# Patient Record
Sex: Male | Born: 1975 | Race: White | Hispanic: No | Marital: Married | Smoking: Current every day smoker
Health system: Southern US, Community
[De-identification: ages and names within clinical notes are randomized; demographics above are authoritative.]

## PROBLEM LIST (undated history)

## (undated) DIAGNOSIS — K219 Gastro-esophageal reflux disease without esophagitis: Secondary | ICD-10-CM

## (undated) DIAGNOSIS — I10 Essential (primary) hypertension: Secondary | ICD-10-CM

## (undated) HISTORY — PX: KNEE SURGERY: SHX244

---

## 2005-03-15 ENCOUNTER — Emergency Department: Payer: Self-pay | Admitting: Unknown Physician Specialty

## 2008-10-22 ENCOUNTER — Emergency Department (HOSPITAL_COMMUNITY): Admission: EM | Admit: 2008-10-22 | Discharge: 2008-10-23 | Payer: Self-pay | Admitting: Emergency Medicine

## 2009-07-29 ENCOUNTER — Emergency Department: Payer: Self-pay | Admitting: Emergency Medicine

## 2011-08-26 LAB — RAPID URINE DRUG SCREEN, HOSP PERFORMED
Amphetamines: NOT DETECTED
Barbiturates: NOT DETECTED
Benzodiazepines: NOT DETECTED

## 2011-08-26 LAB — POCT I-STAT, CHEM 8
BUN: 5 mg/dL — ABNORMAL LOW (ref 6–23)
Calcium, Ion: 1.04 mmol/L — ABNORMAL LOW (ref 1.12–1.32)
Chloride: 107 mEq/L (ref 96–112)
Creatinine, Ser: 0.9 mg/dL (ref 0.4–1.5)
Glucose, Bld: 91 mg/dL (ref 70–99)

## 2017-02-01 ENCOUNTER — Encounter: Payer: Self-pay | Admitting: Emergency Medicine

## 2017-02-01 ENCOUNTER — Ambulatory Visit
Admission: EM | Admit: 2017-02-01 | Discharge: 2017-02-01 | Disposition: A | Discharge status: Home / Self Care | Payer: 59 | Attending: Family Medicine | Admitting: Family Medicine

## 2017-02-01 DIAGNOSIS — J988 Other specified respiratory disorders: Secondary | ICD-10-CM

## 2017-02-01 DIAGNOSIS — B9789 Other viral agents as the cause of diseases classified elsewhere: Secondary | ICD-10-CM

## 2017-02-01 DIAGNOSIS — R05 Cough: Secondary | ICD-10-CM

## 2017-02-01 DIAGNOSIS — R059 Cough, unspecified: Secondary | ICD-10-CM

## 2017-02-01 HISTORY — DX: Essential (primary) hypertension: I10

## 2017-02-01 MED ORDER — BENZONATATE 100 MG PO CAPS: 100.0000 mg | ORAL_CAPSULE | Freq: Three times a day (TID) | ORAL | 0 refills | Status: DC | PRN

## 2017-02-01 MED ORDER — ALBUTEROL SULFATE HFA 108 (90 BASE) MCG/ACT IN AERS: 2.0000 | INHALATION_SPRAY | Freq: Four times a day (QID) | RESPIRATORY_TRACT | 0 refills | Status: DC | PRN

## 2017-02-01 NOTE — ED Provider Notes (Signed)
CSN: [id number]     Arrival date & time 02/01/17  0914 History   First MD Initiated Contact with Patient 02/01/17 1003     Chief Complaint  Patient presents with  . Cough   (Consider location/radiation/quality/duration/timing/severity/associated sxs/prior Treatment) 41 year old male presents with hoarse voice, cough and chest tightness since yesterday. Denies any fever, sore throat or GI symptoms. Has slight nasal congestion but not coughing up any mucus. Has not taken any medication for symptoms. He smokes daily and has history of borderline HTN but not on any medication. His work sent him to be seen to determine if he has a contagious illness.    The history is provided by the patient.    Past Medical History:  Diagnosis Date  . Hypertension    Past Surgical History:  Procedure Laterality Date  . KNEE SURGERY Left    History reviewed. No pertinent family history. Social History  Substance Use Topics  . Smoking status: Current Every Day Smoker    Packs/day: 2.00    Types: Cigarettes  . Smokeless tobacco: Never Used  . Alcohol use Yes    Review of Systems  Constitutional: Negative for activity change, appetite change, chills and fever.  HENT: Positive for congestion (mild), postnasal drip and voice change. Negative for ear pain, mouth sores, rhinorrhea, sinus pain, sinus pressure, sneezing, sore throat and trouble swallowing.   Eyes: Negative for discharge.  Respiratory: Positive for cough and chest tightness. Negative for shortness of breath and wheezing.   Cardiovascular: Negative for chest pain.  Gastrointestinal: Negative for abdominal pain, diarrhea, nausea and vomiting.  Musculoskeletal: Negative for arthralgias, back pain, myalgias and neck pain.  Skin: Negative for rash and wound.  Neurological: Negative for dizziness, syncope, weakness, light-headedness, numbness and headaches.  Hematological: Negative for adenopathy.    Allergies  Penicillins  Home  Medications   Prior to Admission medications   Medication Sig Start Date End Date Taking? Authorizing Provider  albuterol (PROVENTIL HFA;VENTOLIN HFA) 108 (90 Base) MCG/ACT inhaler Inhale 2 puffs into the lungs every 6 (six) hours as needed for shortness of breath. Or cough 02/01/17   Sudie Grumbling, NP  benzonatate (TESSALON) 100 MG capsule Take 1 capsule (100 mg total) by mouth 3 (three) times daily as needed for cough. 02/01/17   Sudie Grumbling, NP   Meds Ordered and Administered this Visit  Medications - No data to display  BP (!) 150/83 (BP Location: Left Arm)   Pulse 81   Temp 98.2 F (36.8 C) (Oral)   Resp 16   Ht 6\' 4"  (1.93 m)   Wt 216 lb (98 kg)   SpO2 99%   BMI 26.29 kg/m  No data found.   Physical Exam  Constitutional: He is oriented to person, place, and time. He appears well-developed and well-nourished. He does not appear ill. No distress.  HENT:  Head: Normocephalic and atraumatic.  Right Ear: Hearing, tympanic membrane, external ear and ear canal normal.  Left Ear: Hearing, tympanic membrane, external ear and ear canal normal.  Nose: Rhinorrhea present. Right sinus exhibits no maxillary sinus tenderness and no frontal sinus tenderness. Left sinus exhibits no maxillary sinus tenderness and no frontal sinus tenderness.  Mouth/Throat: Uvula is midline and mucous membranes are normal. Posterior oropharyngeal erythema present.  Neck: Normal range of motion. Neck supple.  Cardiovascular: Normal rate, regular rhythm and normal heart sounds.   No murmur heard. Pulmonary/Chest: Effort normal. No accessory muscle usage. No tachypnea. No respiratory distress.  He has decreased breath sounds (quieter breath sounds in all lung fields). He has no wheezes. He has no rhonchi. He has no rales.  Musculoskeletal: Normal range of motion.  Lymphadenopathy:    He has no cervical adenopathy.  Neurological: He is alert and oriented to person, place, and time.  Skin: Skin is warm and  dry. Capillary refill takes less than 2 seconds.  Psychiatric: He has a normal mood and affect. His behavior is normal. Judgment and thought content normal.    Urgent Care Course     Procedures (including critical care time)  Labs Review Labs Reviewed - No data to display  Imaging Review No results found.   Visual Acuity Review  Right Eye Distance:   Left Eye Distance:   Bilateral Distance:    Right Eye Near:   Left Eye Near:    Bilateral Near:         MDM   1. Cough   2. Viral respiratory illness    Discussed that he probably has a viral respiratory illness- does not appear to be influenza. Recommend he start Tessalon cough pills 1 every 8 hours as needed. Use Albuterol 2 puffs every 6 hours as needed for cough/chest tightness. Increase fluid intake to help loosen up any mucus in chest. Strongly encouraged to decrease or stop smoking. Reviewed with patient that he may return to work using standard respiratory precautions (cough into elbow, wash hands frequently). Note written for work. Recommend follow-up in 2 to 3 days if not improving.      Sudie GrumblingAnn Berry Ashelyn Mccravy, NP 02/01/17 1430

## 2017-02-01 NOTE — ED Triage Notes (Signed)
Patient c/o hoarse voice, cough and chest congestion for since yesterday.  Patient denies fevers.

## 2017-02-01 NOTE — Discharge Instructions (Signed)
Recommend use Albuterol inhaler 2 inhalations every 6 hours as needed for cough or chest tightness. May take Tessalon cough pills 1 every 8 hours as needed. Increase fluid intake to help loosen up any mucus in chest. Follow-up in 3 to 4 days if not improving.

## 2017-03-07 ENCOUNTER — Ambulatory Visit
Admission: EM | Admit: 2017-03-07 | Discharge: 2017-03-07 | Disposition: A | Discharge status: Home / Self Care | Payer: BLUE CROSS/BLUE SHIELD | Attending: Family Medicine | Admitting: Family Medicine

## 2017-03-07 DIAGNOSIS — R0789 Other chest pain: Secondary | ICD-10-CM | POA: Insufficient documentation

## 2017-03-07 DIAGNOSIS — R079 Chest pain, unspecified: Secondary | ICD-10-CM

## 2017-03-07 DIAGNOSIS — I10 Essential (primary) hypertension: Secondary | ICD-10-CM | POA: Insufficient documentation

## 2017-03-07 DIAGNOSIS — Z880 Allergy status to penicillin: Secondary | ICD-10-CM | POA: Diagnosis not present

## 2017-03-07 DIAGNOSIS — F17210 Nicotine dependence, cigarettes, uncomplicated: Secondary | ICD-10-CM | POA: Insufficient documentation

## 2017-03-07 NOTE — ED Provider Notes (Signed)
[facility]    CSN: [id number] Arrival date & time: 03/07/17  1615     History   Chief Complaint Chief Complaint  Patient presents with  . Chest Pain    HPI David Fitzpatrick is a 41 y.o. male.   Patient is a 41 year old white male with a history of chest pain. Stool is usual activity including lawn work on Saturday where he had to lift his lawnmower. Postop problem until Monday when he was at work so having some left-sided and midsternal chest pain. He worked a full 12 hour shift, home as he got home the pain did improve as much as he rested. States his wife wanted him to go the hospital last night but he declined. He was fine this morning when he woke up and after he went to work and was exerting himself he started having the pain again. The pain got so noticeable in his discomfort became stenosed but his boss recommended he come to the urgent care to be seen and evaluated. He still reports having pain in the mid left sternal area near the xiphoid process. Past smoker history he does smoke and he missed smoking when he is off about 2 packs cigarettes a day when he is working he can't smoke during work but he does smoke some cigarettes during the past 3 breaks that he has during the day. As well as his smoking he does admit to drinking 8-9 beers a day that he works after work to relax during the days when he is off he drinks more. He denies having history hypertension according to his chart he's been placed on lisinopril he states his blood pressures only elevated when he sees Dr. and that his wife was in the health field checks his blood pressure is normal. No known history hyperlipidemia his grandfather did die of a massive heart attack but when he was in his 14s. He had an EKG about a year and half ago which was read to be normal with some questionable early repolarization   The history is provided by the patient. No language interpreter was used.  Chest Pain  Pain location:   Substernal area Pain quality: pressure, shooting and stabbing   Pain radiates to:  Does not radiate Onset quality:  Sudden Duration:  2 days Timing:  Constant Progression:  Worsening Chronicity:  New Context: breathing and movement   Relieved by:  Nothing Associated symptoms: no abdominal pain, no AICD problem, no altered mental status, no PND and no shortness of breath   Risk factors: hypertension, male sex and smoking     Past Medical History:  Diagnosis Date  . Hypertension     There are no active problems to display for this patient.   Past Surgical History:  Procedure Laterality Date  . KNEE SURGERY Left        Home Medications    Prior to Admission medications   Medication Sig Start Date End Date Taking? Authorizing Provider  lisinopril-hydrochlorothiazide (PRINZIDE,ZESTORETIC) 10-12.5 MG tablet Take 1 tablet by mouth daily.   Yes Historical Provider, MD    Family History Family History  Problem Relation Age of Onset  . Heart attack Other     Social History Social History  Substance Use Topics  . Smoking status: Current Every Day Smoker    Packs/day: 2.00    Types: Cigarettes  . Smokeless tobacco: Never Used  . Alcohol use Yes     Allergies   Penicillins  Review of Systems Review of Systems  Respiratory: Negative for shortness of breath.   Cardiovascular: Positive for chest pain. Negative for PND.  Gastrointestinal: Negative for abdominal pain.  All other systems reviewed and are negative.    Physical Exam Triage Vital Signs ED Triage Vitals  Enc Vitals Group     BP 03/07/17 1625 (!) 166/97     Pulse Rate 03/07/17 1625 84     Resp 03/07/17 1625 18     Temp 03/07/17 1625 98.1 F (36.7 C)     Temp Source 03/07/17 1625 Oral     SpO2 03/07/17 1625 100 %     Weight 03/07/17 1621 216 lb (98 kg)     Height 03/07/17 1621  (1.93 m)     Head Circumference --      Peak Flow --      Pain Score 03/07/17 1621 5     Pain Loc --       Pain Edu? --      Excl. in GC? --    No data found.   Updated Vital Signs BP (!) 166/97 (BP Location: Left Arm)   Pulse 84   Temp 98.1 F (36.7 C) (Oral)   Resp 18   Ht  (1.93 m)   Wt 216 lb (98 kg)   SpO2 100%   BMI 26.29 kg/m   Visual Acuity Right Eye Distance:   Left Eye Distance:   Bilateral Distance:    Right Eye Near:   Left Eye Near:    Bilateral Near:     Physical Exam  Constitutional: He is oriented to person, place, and time. He appears well-developed and well-nourished. No distress.  HENT:  Head: Normocephalic.  Right Ear: External ear normal.  Left Ear: External ear normal.  Eyes: Pupils are equal, round, and reactive to light.  Neck: Normal range of motion. Neck supple.  Cardiovascular: Normal rate, regular rhythm and normal heart sounds.   Pulmonary/Chest: Effort normal and breath sounds normal.    He does have tenderness over the left lower sternum  Musculoskeletal: Normal range of motion.  Neurological: He is alert and oriented to person, place, and time.  Skin: Skin is warm. He is not diaphoretic.  Psychiatric: He has a normal mood and affect.  Vitals reviewed.    UC Treatments / Results  Labs (all labs ordered are listed, but only abnormal results are displayed) Labs Reviewed - No data to display  EKG  EKG Interpretation None      ED ECG REPORT I, Yaretzy Olazabal H, the attending physician, personally viewed and interpreted this ECG.   Date: 03/07/2017  EKG Time: 16:23:40  Rate: 87  Rhythm: there are no previous tracings available for comparison, normal sinus rhythm, nonspecific ST and T waves changes  Axis: 64  Intervals:none  ST&T Change: nonspecific   Radiology No results found.  Procedures Procedures (including critical care time)  Medications Ordered in UC Medications - No data to display   Initial Impression / Assessment and Plan / UC Course  I have reviewed the triage vital signs and the nursing  notes.  Pertinent labs & imaging results that were available during my care of the patient were reviewed by me and considered in my medical decision making (see chart for details).    While patient does have tenderness over the chest wall I am concerned about his age 69 his heavy smoking history the positive hypertension that he disputes discounts. Explained to him  we cannot do serial cardiac enzymes here also his EKG shows some mild nonspecific ST changes that I think were not seen on earlier EKG. After further discussion with patient he is agreeing to go to [facility] this [place] [facility] with his wife going to see ED. Discussed about giving him 4 baby aspirins but he is informed me that he is ray taken about 9 Goody powders today.   Final Clinical Impressions(s) / UC Diagnoses   Final diagnoses:  Chest wall pain  Nonspecific chest pain    New Prescriptions New Prescriptions   No medications on file  Note: This dictation was prepared with Dragon dictation along with smaller phrase technology. Any transcriptional errors that result from this process are unintentional.   Hassan Rowan, MD 03/07/17 [phone]

## 2017-03-07 NOTE — ED Triage Notes (Signed)
Pt c/o mid chest pain that has lasted about 3 days. He says he has had a lung infection about 3 weeks ago and he says he may have pulled a muscle.

## 2017-06-05 ENCOUNTER — Ambulatory Visit (INDEPENDENT_AMBULATORY_CARE_PROVIDER_SITE_OTHER): Payer: Worker's Compensation

## 2017-06-05 ENCOUNTER — Ambulatory Visit
Admission: EM | Admit: 2017-06-05 | Discharge: 2017-06-05 | Disposition: A | Discharge status: Home / Self Care | Payer: Worker's Compensation | Attending: Family Medicine | Admitting: Family Medicine

## 2017-06-05 DIAGNOSIS — S9002XA Contusion of left ankle, initial encounter: Secondary | ICD-10-CM

## 2017-06-05 DIAGNOSIS — M79672 Pain in left foot: Secondary | ICD-10-CM

## 2017-06-05 DIAGNOSIS — W208XXA Other cause of strike by thrown, projected or falling object, initial encounter: Secondary | ICD-10-CM

## 2017-06-05 HISTORY — DX: Gastro-esophageal reflux disease without esophagitis: K21.9

## 2017-06-05 NOTE — ED Notes (Signed)
Drug screen obtained in clinic

## 2017-06-05 NOTE — ED Provider Notes (Signed)
CSN: [id number]     Arrival date & time 06/05/17  7829 History   First MD Initiated Contact with Patient 06/05/17 0932     Chief Complaint  Patient presents with  . Work Related Injury  . Foot Pain    left   (Consider location/radiation/quality/duration/timing/severity/associated sxs/prior Treatment) HPI  This a 41 year old male who presents with a work-related injury to his left ankle that occurred Thursday 4 days prior to this visit. 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 He has had some pain and swelling and because of limping today at work was recommended he be seen by his supervisor. At the present time there does not appear to be any ecchymosis or erythema but does have some small amount of swelling over the area of the anterior fibulotalar ligament and lateral malleolus.         Past Medical History:  Diagnosis Date  . GERD (gastroesophageal reflux disease)   . Hypertension    Past Surgical History:  Procedure Laterality Date  . KNEE SURGERY Left    Family History  Problem Relation Age of Onset  . Heart attack Other    Social History  Substance Use Topics  . Smoking status: Current Every Day Smoker    Packs/day: 2.00    Types: Cigarettes  . Smokeless tobacco: Never Used  . Alcohol use Yes     Comment: daily    Review of Systems  Constitutional: Positive for activity change, chills, fatigue and fever.  Musculoskeletal: Positive for arthralgias.  All other systems reviewed and are negative.   Allergies  Penicillins  Home Medications   Prior to Admission medications   Medication Sig Start Date End Date Taking? Authorizing Provider  lisinopril-hydrochlorothiazide (PRINZIDE,ZESTORETIC) 10-12.5 MG tablet Take 1 tablet by mouth daily.   Yes [provider]  omeprazole (PRILOSEC) 20 MG capsule Take 20 mg by mouth daily.   Yes [provider]   Meds Ordered and  Administered this Visit  Medications - No data to display  BP (!) 161/102 (BP Location: Left Arm)   Pulse 80   Temp 98.2 F (36.8 C) (Oral)   Resp 18   Ht 6\' 4"  (1.93 m)   Wt 216 lb (98 kg)   SpO2 98%   BMI 26.29 kg/m  No data found.   Physical Exam  Constitutional: He appears well-developed and well-nourished. No distress.  HENT:  Head: Normocephalic.  Eyes: Pupils are equal, round, and reactive to light.  Neck: Normal range of motion.  Musculoskeletal:  Examination of the left ankle shows no significant contusion erythema and only mild swelling of the distal anterior lateral malleolus extending over into the anterior fibulotalar ligament area. Patient has good  dorsiflexion and decreased plantar flexion to approximately 35. Subtalar motion is preserved. There is no calcaneal tenderness. There is no tenderness of the tarsals or metatarsals. Maximum tenderness is localized over the distal fibula laterally as well as the anterior fibulotalar ligament.  Skin: He is not diaphoretic.  Nursing note and vitals reviewed.   Urgent Care Course     Procedures (including critical care time)  Labs Review Labs Reviewed - No data to display  Imaging Review Dg Ankle Complete Left  Result Date: 06/05/2017 CLINICAL DATA:  50 pound object fell on lateral ankle 4 days ago EXAM: LEFT ANKLE COMPLETE - 3+ VIEW COMPARISON:  None. FINDINGS: There is no evidence of fracture, dislocation,  or joint effusion. There is no evidence of arthropathy or other focal bone abnormality. Soft tissues are unremarkable. IMPRESSION: Negative. Electronically Signed   By: Marlan Palauharles  Clark M.D.   On: 06/05/2017 10:12     Visual Acuity Review  Right Eye Distance:   Left Eye Distance:   Bilateral Distance:    Right Eye Near:   Left Eye Near:    Bilateral Near:     Patient was followed with an ankle stirrup that he will use for comfort.    MDM   1. Contusion of left ankle, initial encounter    Patient  has contusion of his left ankle. Provide him with a stirrup for comfort and support he can use at work. We'll have him stay off work today for elevation and icing and return to work tomorrow. He has a prescription strength ibuprofen which she can take as prescribed. If he is not improving he should follow-up with Glen[facility].    Lutricia FeilRoemer, Angeleigh Chiasson P, PA-C 06/05/17 1047

## 2017-06-05 NOTE — ED Triage Notes (Signed)
Patient complains of work related injury that occurred on Thursday pm. Patient states that he dropped a mole strap that weighs around 57lb and dropped in 4 feet on to his ankle and foot. Patient states that he has noticed pain and swelling since injury.

## 2017-10-11 ENCOUNTER — Ambulatory Visit
Admission: EM | Admit: 2017-10-11 | Discharge: 2017-10-11 | Disposition: A | Discharge status: Other Acute Inpatient Hospital | Payer: BLUE CROSS/BLUE SHIELD | Attending: Family Medicine | Admitting: Family Medicine

## 2017-10-11 ENCOUNTER — Other Ambulatory Visit: Payer: Self-pay

## 2017-10-11 ENCOUNTER — Encounter: Payer: Self-pay | Admitting: Emergency Medicine

## 2017-10-11 DIAGNOSIS — F1010 Alcohol abuse, uncomplicated: Secondary | ICD-10-CM

## 2017-10-11 NOTE — ED Provider Notes (Signed)
Not seen.  Went to occupational health.  Saphira Lahmann DO     Wolf Trapook, WinifredJayce G, [place]DO 10/11/17 1020

## 2017-10-11 NOTE — ED Notes (Signed)
Patient will be seen at [facility] for his visit.

## 2017-10-11 NOTE — ED Triage Notes (Signed)
Patient states that he has stopped drinking alcohol for the past 2 weeks.  Patient states that he needs a doctor's note to return to work.

## 2018-07-02 IMAGING — CR DG ANKLE COMPLETE 3+V*L*
3 series · 3 of 3 positions shown · non-contrast
Comparison: None.

CLINICAL DATA: 50 pound object fell on lateral ankle 4 days ago

EXAM:
LEFT ANKLE COMPLETE - 3+ VIEW

[ankle ap]
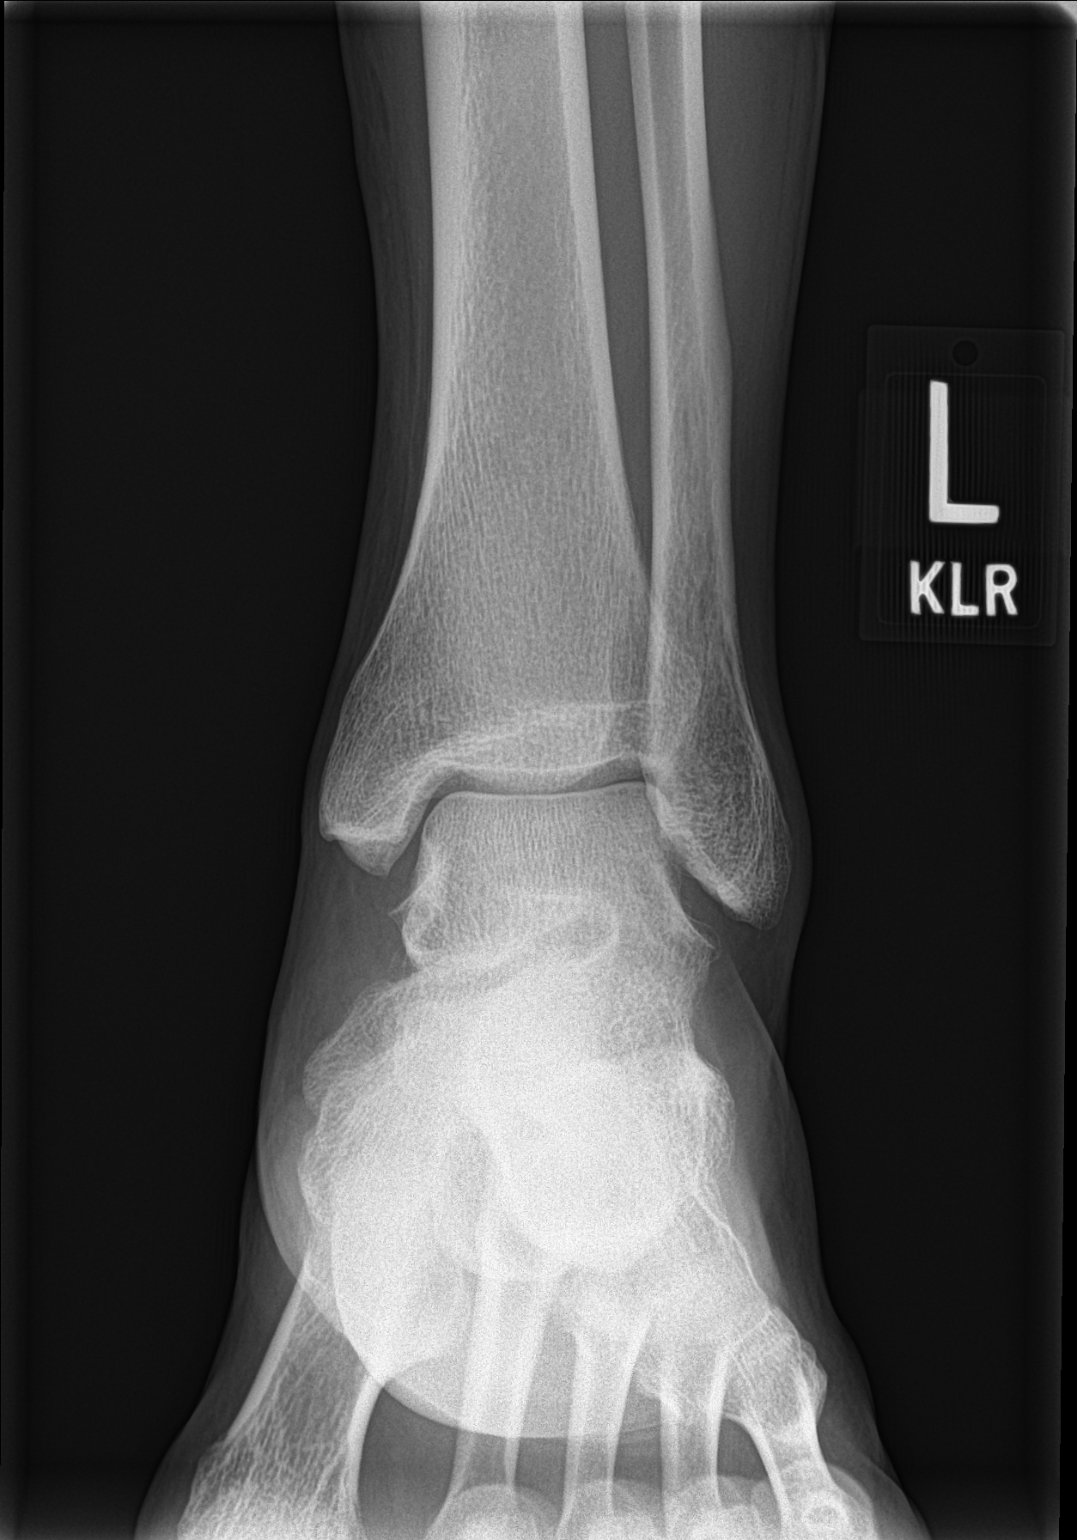

[ankle obl]
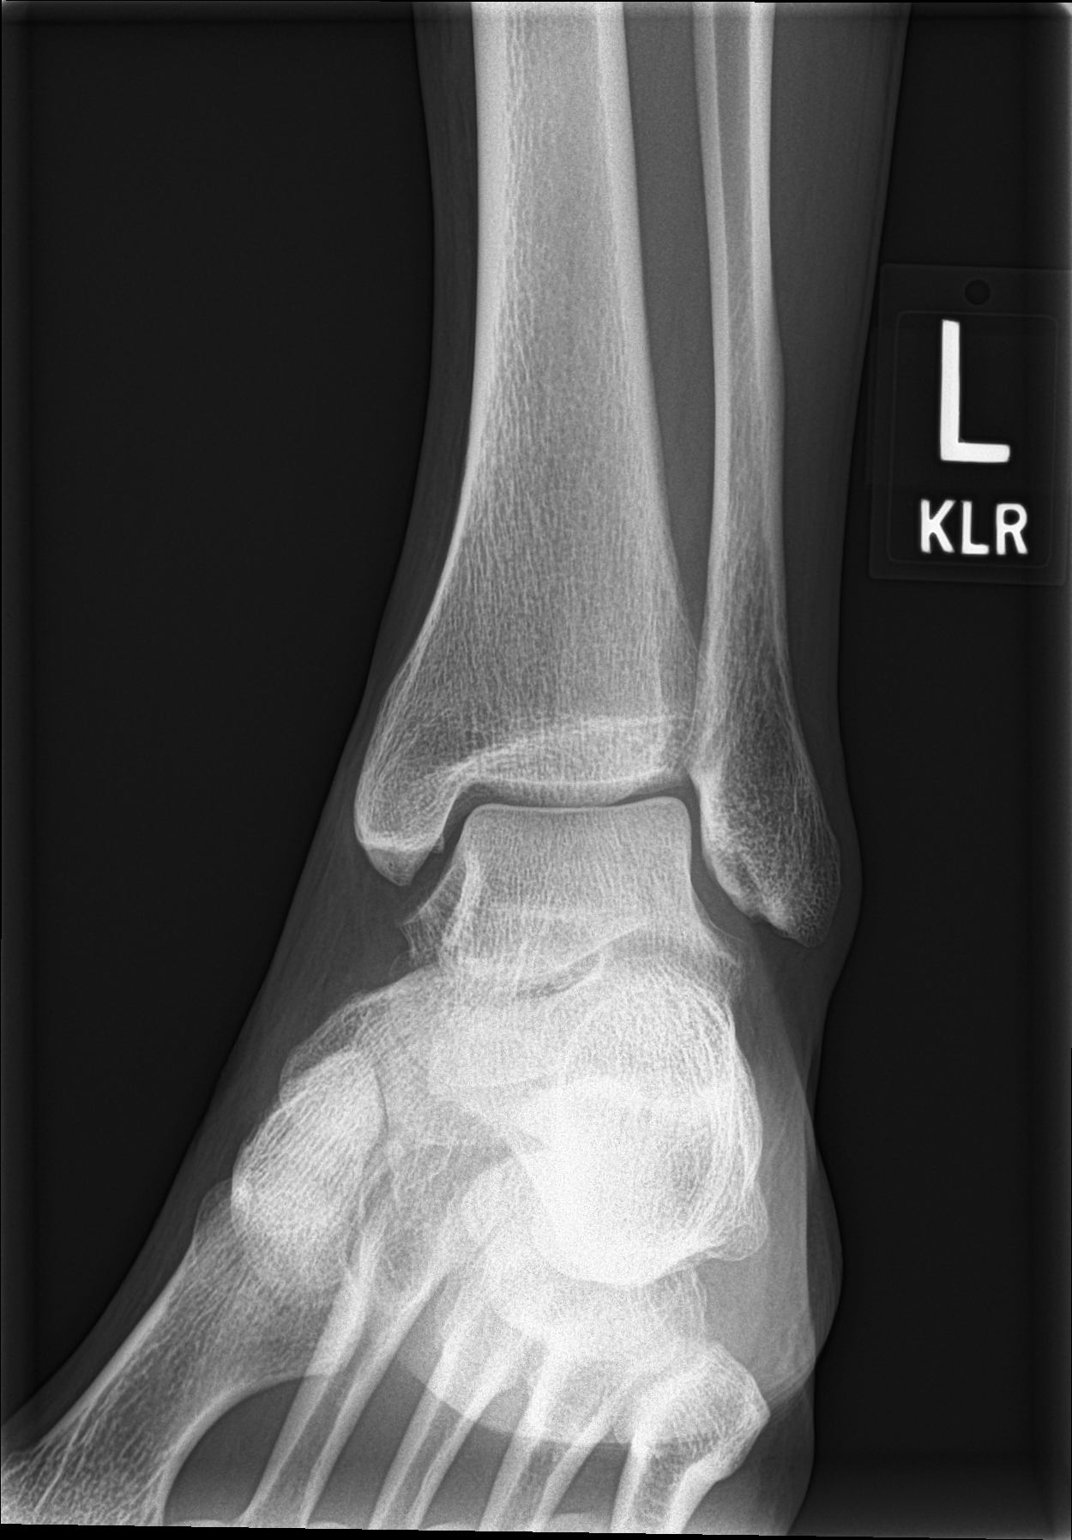

[ankle lat]
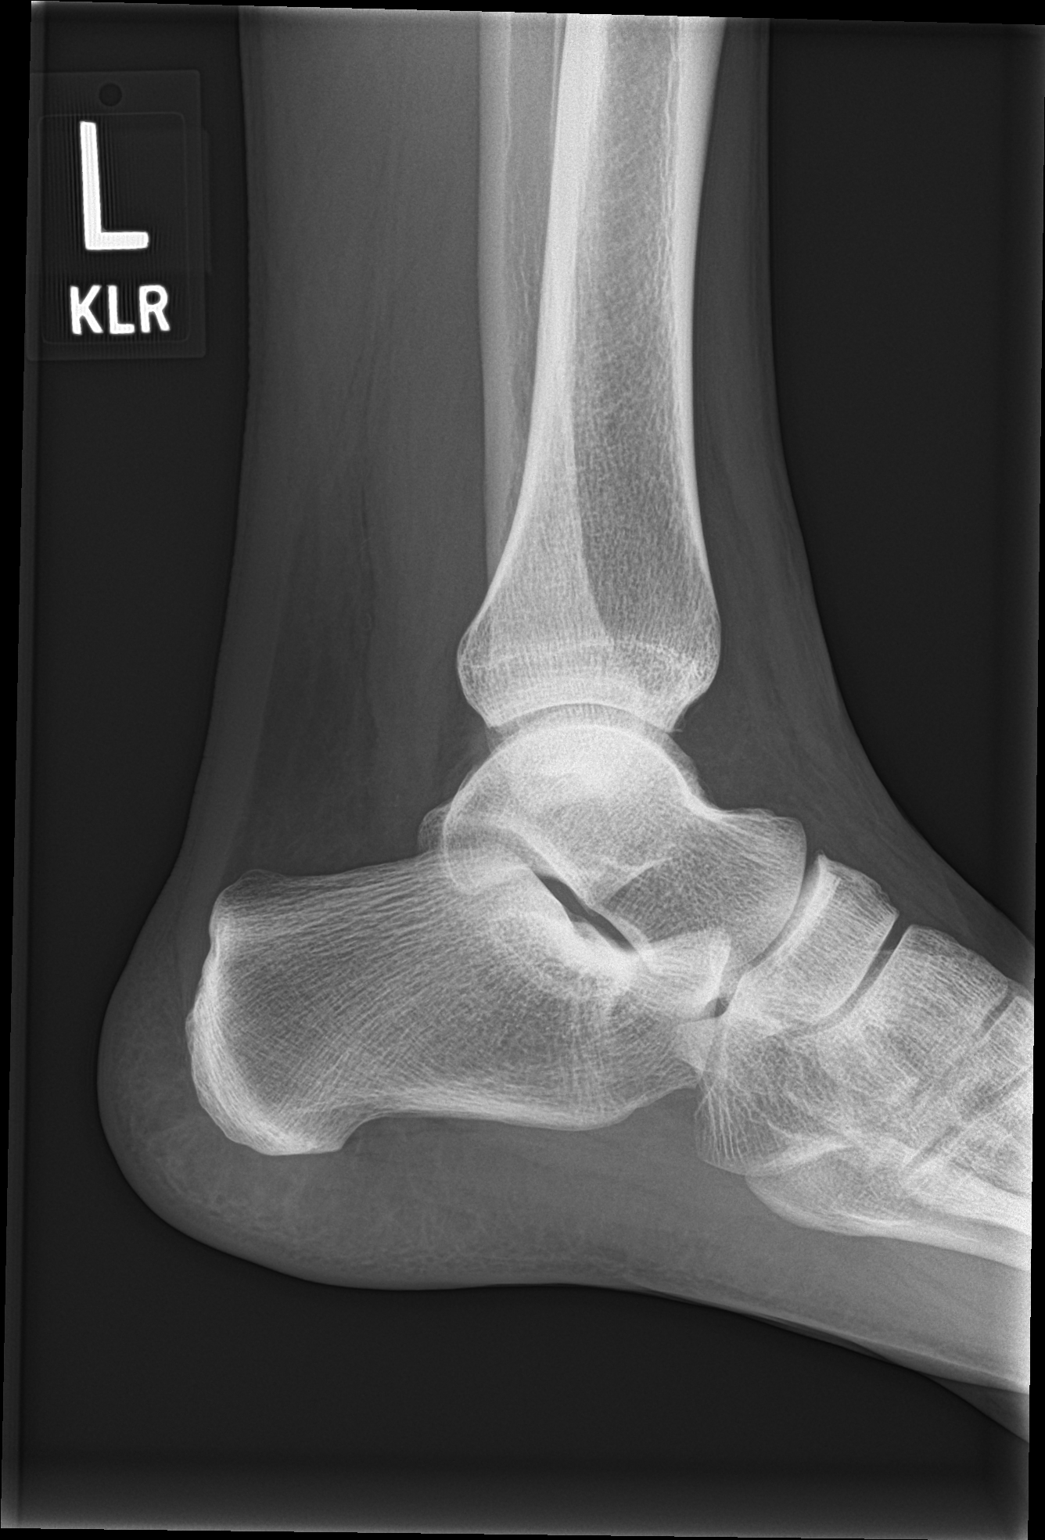

[3 of 3 positions shown; findings below may reference images not displayed]

FINDINGS: There is no evidence of fracture, dislocation, or joint effusion.
There is no evidence of arthropathy or other focal bone abnormality.
Soft tissues are unremarkable.
IMPRESSION: Negative.

## 2021-08-21 DEATH — deceased
